# Patient Record
Sex: Female | Born: 1980 | Race: White | Hispanic: No | Marital: Married
Health system: Southern US, Community
[De-identification: ages and names within clinical notes are randomized; demographics above are authoritative.]

---

## 2017-11-04 ENCOUNTER — Ambulatory Visit (HOSPITAL_COMMUNITY): Payer: 59 | Admitting: Certified Registered Nurse Anesthetist

## 2017-11-04 ENCOUNTER — Other Ambulatory Visit: Payer: Self-pay

## 2017-11-04 ENCOUNTER — Ambulatory Visit (HOSPITAL_COMMUNITY): Payer: 59

## 2017-11-04 ENCOUNTER — Encounter (HOSPITAL_COMMUNITY)
Admission: AD | Disposition: A | Discharge status: Home / Self Care | Payer: Self-pay | Source: Ambulatory Visit | Attending: Obstetrics and Gynecology

## 2017-11-04 ENCOUNTER — Ambulatory Visit (HOSPITAL_COMMUNITY)
Admission: AD | Admit: 2017-11-04 | Discharge: 2017-11-04 | Disposition: A | Discharge status: Home / Self Care | Payer: 59 | Source: Ambulatory Visit | Attending: Obstetrics and Gynecology | Admitting: Obstetrics and Gynecology

## 2017-11-04 DIAGNOSIS — O039 Complete or unspecified spontaneous abortion without complication: Secondary | ICD-10-CM

## 2017-11-04 DIAGNOSIS — O021 Missed abortion: Secondary | ICD-10-CM | POA: Diagnosis not present

## 2017-11-04 DIAGNOSIS — Z791 Long term (current) use of non-steroidal anti-inflammatories (NSAID): Secondary | ICD-10-CM | POA: Insufficient documentation

## 2017-11-04 HISTORY — PX: DILATION AND EVACUATION: SHX1459

## 2017-11-04 LAB — CBC
HCT: 41.1 % (ref 36.0–46.0)
Hemoglobin: 14.4 g/dL (ref 12.0–15.0)
MCH: 32.1 pg (ref 26.0–34.0)
MCHC: 35 g/dL (ref 30.0–36.0)
MCV: 91.5 fL (ref 80.0–100.0)
NRBC: 0 % (ref 0.0–0.2)
PLATELETS: 249 10*3/uL (ref 150–400)
RBC: 4.49 MIL/uL (ref 3.87–5.11)
RDW: 12.6 % (ref 11.5–15.5)
WBC: 9.2 10*3/uL (ref 4.0–10.5)

## 2017-11-04 LAB — ABO/RH: ABO/RH(D): A POS

## 2017-11-04 LAB — TYPE AND SCREEN
ABO/RH(D): A POS
ANTIBODY SCREEN: NEGATIVE

## 2017-11-04 SURGERY — DILATION AND EVACUATION, UTERUS
Anesthesia: General | Site: Vagina

## 2017-11-04 MED ORDER — LIDOCAINE HCL (CARDIAC) PF 100 MG/5ML IV SOSY
PREFILLED_SYRINGE | INTRAVENOUS | Status: AC
  Filled 2017-11-04: qty 5

## 2017-11-04 MED ORDER — PROPOFOL 10 MG/ML IV BOLUS
INTRAVENOUS | Status: AC
  Filled 2017-11-04: qty 40

## 2017-11-04 MED ORDER — FLUMAZENIL 0.5 MG/5ML IV SOLN
INTRAVENOUS | Status: AC
  Filled 2017-11-04: qty 5

## 2017-11-04 MED ORDER — ACETAMINOPHEN 500 MG PO TABS
ORAL_TABLET | ORAL | Status: AC
  Filled 2017-11-04: qty 2

## 2017-11-04 MED ORDER — HYDROCODONE-ACETAMINOPHEN 5-325 MG PO TABS: 1.0000 | ORAL_TABLET | Freq: Four times a day (QID) | ORAL | 0 refills | Status: AC | PRN

## 2017-11-04 MED ORDER — GLYCOPYRROLATE 0.2 MG/ML IJ SOLN
INTRAMUSCULAR | Status: AC
  Filled 2017-11-04: qty 1

## 2017-11-04 MED ORDER — LACTATED RINGERS IV SOLN
INTRAVENOUS | Status: DC
  Administered 2017-11-04 (×2): via INTRAVENOUS

## 2017-11-04 MED ORDER — KETOROLAC TROMETHAMINE 30 MG/ML IJ SOLN
INTRAMUSCULAR | Status: AC
  Filled 2017-11-04: qty 1

## 2017-11-04 MED ORDER — SCOPOLAMINE 1 MG/3DAYS TD PT72: 1.0000 | MEDICATED_PATCH | TRANSDERMAL | Status: DC

## 2017-11-04 MED ORDER — DEXAMETHASONE SODIUM PHOSPHATE 4 MG/ML IJ SOLN
INTRAMUSCULAR | Status: DC | PRN
  Administered 2017-11-04: 8 mg via INTRAVENOUS

## 2017-11-04 MED ORDER — GLYCOPYRROLATE 0.2 MG/ML IJ SOLN
INTRAMUSCULAR | Status: DC | PRN
  Administered 2017-11-04: 0.1 mg via INTRAVENOUS

## 2017-11-04 MED ORDER — SCOPOLAMINE 1 MG/3DAYS TD PT72
1.0000 | MEDICATED_PATCH | Freq: Once | TRANSDERMAL | Status: DC
  Administered 2017-11-04: 1.5 mg via TRANSDERMAL

## 2017-11-04 MED ORDER — ONDANSETRON HCL 4 MG/2ML IJ SOLN
INTRAMUSCULAR | Status: DC | PRN
  Administered 2017-11-04: 4 mg via INTRAVENOUS

## 2017-11-04 MED ORDER — SCOPOLAMINE 1 MG/3DAYS TD PT72
MEDICATED_PATCH | TRANSDERMAL | Status: AC
  Filled 2017-11-04: qty 1

## 2017-11-04 MED ORDER — MIDAZOLAM HCL 2 MG/2ML IJ SOLN
INTRAMUSCULAR | Status: AC
  Filled 2017-11-04: qty 2

## 2017-11-04 MED ORDER — ACETAMINOPHEN 500 MG PO TABS
1000.0000 mg | ORAL_TABLET | Freq: Once | ORAL | Status: AC
  Administered 2017-11-04: 1000 mg via ORAL

## 2017-11-04 MED ORDER — FLUMAZENIL 0.5 MG/5ML IV SOLN
INTRAVENOUS | Status: DC | PRN
  Administered 2017-11-04: 0.3 mg via INTRAVENOUS

## 2017-11-04 MED ORDER — PROPOFOL 10 MG/ML IV BOLUS
INTRAVENOUS | Status: DC | PRN
  Administered 2017-11-04: 180 mg via INTRAVENOUS

## 2017-11-04 MED ORDER — LIDOCAINE HCL 1 % IJ SOLN
INTRAMUSCULAR | Status: DC | PRN
  Administered 2017-11-04: 10 mL

## 2017-11-04 MED ORDER — MIDAZOLAM HCL 2 MG/2ML IJ SOLN
INTRAMUSCULAR | Status: DC | PRN
  Administered 2017-11-04: 2 mg via INTRAVENOUS

## 2017-11-04 MED ORDER — LIDOCAINE HCL URETHRAL/MUCOSAL 2 % EX GEL
CUTANEOUS | Status: AC
  Filled 2017-11-04: qty 5

## 2017-11-04 MED ORDER — IBUPROFEN 800 MG PO TABS: 800.0000 mg | ORAL_TABLET | Freq: Three times a day (TID) | ORAL | 0 refills | Status: AC | PRN

## 2017-11-04 MED ORDER — FENTANYL CITRATE (PF) 100 MCG/2ML IJ SOLN
INTRAMUSCULAR | Status: AC
  Filled 2017-11-04: qty 4

## 2017-11-04 MED ORDER — FENTANYL CITRATE (PF) 250 MCG/5ML IJ SOLN
INTRAMUSCULAR | Status: DC | PRN
  Administered 2017-11-04 (×3): 50 ug via INTRAVENOUS

## 2017-11-04 MED ORDER — LIDOCAINE HCL 1 % IJ SOLN
INTRAMUSCULAR | Status: AC
  Filled 2017-11-04: qty 20

## 2017-11-04 MED ORDER — LIDOCAINE HCL (CARDIAC) PF 100 MG/5ML IV SOSY
PREFILLED_SYRINGE | INTRAVENOUS | Status: DC | PRN
  Administered 2017-11-04: 100 mg via INTRAVENOUS

## 2017-11-04 MED ORDER — KETOROLAC TROMETHAMINE 30 MG/ML IJ SOLN
INTRAMUSCULAR | Status: DC | PRN
  Administered 2017-11-04: 30 mg via INTRAVENOUS

## 2017-11-04 MED ORDER — ONDANSETRON HCL 4 MG/2ML IJ SOLN
INTRAMUSCULAR | Status: AC
  Filled 2017-11-04: qty 2

## 2017-11-04 SURGICAL SUPPLY — 18 items
CATH ROBINSON RED A/P 16FR (CATHETERS) ×3 IMPLANT
DECANTER SPIKE VIAL GLASS SM (MISCELLANEOUS) ×3 IMPLANT
DILATOR CANAL MILEX (MISCELLANEOUS) IMPLANT
GLOVE BIO SURGEON STRL SZ7 (GLOVE) ×3 IMPLANT
GLOVE BIOGEL PI IND STRL 7.0 (GLOVE) ×1 IMPLANT
GLOVE BIOGEL PI INDICATOR 7.0 (GLOVE) ×2
GOWN STRL REUS W/TWL LRG LVL3 (GOWN DISPOSABLE) ×6 IMPLANT
KIT BERKELEY 1ST TRIMESTER 3/8 (MISCELLANEOUS) ×3 IMPLANT
NS IRRIG 1000ML POUR BTL (IV SOLUTION) ×3 IMPLANT
PACK VAGINAL MINOR WOMEN LF (CUSTOM PROCEDURE TRAY) ×3 IMPLANT
PAD OB MATERNITY 4.3X12.25 (PERSONAL CARE ITEMS) ×3 IMPLANT
PAD PREP 24X48 CUFFED NSTRL (MISCELLANEOUS) ×3 IMPLANT
SET BERKELEY SUCTION TUBING (SUCTIONS) ×3 IMPLANT
TOWEL OR 17X24 6PK STRL BLUE (TOWEL DISPOSABLE) ×6 IMPLANT
VACURETTE 10 RIGID CVD (CANNULA) IMPLANT
VACURETTE 7MM CVD STRL WRAP (CANNULA) IMPLANT
VACURETTE 8 RIGID CVD (CANNULA) IMPLANT
VACURETTE 9 RIGID CVD (CANNULA) IMPLANT

## 2017-11-04 NOTE — Anesthesia Preprocedure Evaluation (Signed)
Anesthesia Evaluation  Patient identified by MRN, date of birth, ID band Patient awake    Reviewed: Allergy & Precautions, H&P , NPO status , Patient's Chart, lab work & pertinent test results  Airway Mallampati: II  TM Distance: >3 FB Neck ROM: Full    Dental no notable dental hx. (+) Teeth Intact   Pulmonary neg pulmonary ROS,    Pulmonary exam normal breath sounds clear to auscultation       Cardiovascular negative cardio ROS Normal cardiovascular exam Rhythm:Regular Rate:Normal     Neuro/Psych negative neurological ROS  negative psych ROS   GI/Hepatic negative GI ROS,   Endo/Other    Renal/GU      Musculoskeletal   Abdominal   Peds  Hematology   Anesthesia Other Findings   Reproductive/Obstetrics (+) Pregnancy                             Lab Results  Component Value Date   WBC 9.2 11/04/2017   HGB 14.4 11/04/2017   HCT 41.1 11/04/2017   MCV 91.5 11/04/2017   PLT 249 11/04/2017    Anesthesia Physical Anesthesia Plan  ASA: II  Anesthesia Plan: General   Post-op Pain Management:    Induction: Intravenous  PONV Risk Score and Plan: 4 or greater and Ondansetron, Treatment may vary due to age or medical condition and Scopolamine patch - Pre-op  Airway Management Planned: LMA  Additional Equipment:   Intra-op Plan:   Post-operative Plan:   Informed Consent: I have reviewed the patients History and Physical, chart, labs and discussed the procedure including the risks, benefits and alternatives for the proposed anesthesia with the patient or authorized representative who has indicated his/her understanding and acceptance.   Dental advisory given  Plan Discussed with:   Anesthesia Plan Comments: (10 wks NPO since before 0900)        Anesthesia Quick Evaluation

## 2017-11-04 NOTE — Transfer of Care (Signed)
Immediate Anesthesia Transfer of Care Note  Patient: Ariel Walsh  Procedure(s) Performed: DILATATION AND EVACUATION (N/A Vagina )  Patient Location: PACU  Anesthesia Type:General  Level of Consciousness: awake, alert  and oriented  Airway & Oxygen Therapy: Patient Spontanous Breathing and Patient connected to nasal cannula oxygen  Post-op Assessment: Report given to RN and Post -op Vital signs reviewed and stable  Post vital signs: Reviewed and stable  Last Vitals:  Vitals Value Taken Time  BP 105/56 11/04/2017  5:15 PM  Temp    Pulse 72 11/04/2017  5:18 PM  Resp 13 11/04/2017  5:18 PM  SpO2 100 % 11/04/2017  5:18 PM  Vitals shown include unvalidated device data.  Last Pain:  Vitals:   11/04/17 1508  TempSrc: Oral  PainSc: 0-No pain      Patients Stated Pain Goal: 4 (11/04/17 1508)  Complications: No apparent anesthesia complications

## 2017-11-04 NOTE — Op Note (Signed)
Pre-Operative Diagnosis: 1) 10+2-week missed miscarriage Postoperative Diagnosis: 1) 10+2-week missed miscarriage Procedure: Suction dilation and evacuation Surgeon: Dr. Waynard Reeds Assistant: None Operative Findings: 8-week size uterus. Specimen: Products of conception EBL: Total I/O In: 1600 [I.V.:1600] Out: 225 [Urine:200; Blood:25]   Ms. Lobo is a 37 year old gravida 1 para 0 who presents for definitive surgical management for missed miscarriage. Please see the patient's history and physical for complete details of the history. Management options were discussed with the patient. R/B/A reviewed. Following appropriate informed consent was taken to the operating room. The patient was appropriately identified during a time out procedure. General anesthesia was administered and the patient was placed in the dorsal lithotomy position. The patient was prepped and draped in the normal sterile fashion. A speculum was placed into the vagina, a single-tooth tenaculum was placed on the anterior lip of the cervix, and 10 cc of 1% lidocaine was administered in a paracervical fashion. The cervix was serially dilated with Hank dilators. . A 8 suction curet was then passed to the fundus, the vacuum was engaged, and 3 suction passes were performed with a curette. A Sharp curettage was performed and a gritty texture was noted. A final suction pass was performed with minimal results. This completed the procedure. The patient tolerated the procedure well was brought to the recovery room in stable condition for the procedure. All sponge and needle counts correct x2.

## 2017-11-04 NOTE — H&P (Signed)
Ariel Walsh is an 37 y.o. female.  37 yo G1P0 @ 10+2 by LMP presents for D&E for missed ab. Office [place] yesterday confirmed demise of fetus. CRL 7+6 without cardiac activity. Pt had a previous US in [facility] office at 7+1 with cardiac activity. Repeat US here confirms demise. Management options reviewed with the patient and she wishes to proceed with D&E.   No LMP recorded.    No past medical history on file.    No family history on file.  Social History:  has no tobacco, alcohol, and drug history on file.  Allergies: Allergies not on file  No medications prior to admission.    ROS   There were no vitals taken for this visit. Physical Exam  Vitals:   11/04/17 1508  BP: 136/85  Pulse: 74  Resp: 16  Temp: 98.6 F (37 C)  TempSrc: Oral  SpO2: 100%  Weight: 71.7 kg  Height: 5\' 5"  (1.651 m)   Results for orders placed or performed during the hospital encounter of 11/04/17 (from the past 24 hour(s))  CBC     Status: None   Collection Time: 11/04/17  2:00 PM  Result Value Ref Range   WBC 9.2 4.0 - 10.5 K/uL   RBC 4.49 3.87 - 5.11 MIL/uL   Hemoglobin 14.4 12.0 - 15.0 g/dL   HCT 16.1 09.6 - 04.5 %   MCV 91.5 80.0 - 100.0 fL   MCH 32.1 26.0 - 34.0 pg   MCHC 35.0 30.0 - 36.0 g/dL   RDW 40.9 81.1 - 91.4 %   Platelets 249 150 - 400 K/uL   nRBC 0.0 0.0 - 0.2 %   Aox3, NAD Abd soft Normal work of breathing  No results found for this or any previous visit (from the past 24 hour(s)).  No results found.  Assessment/Plan: 1) Admit 2) informed consent obtained 3) SCDs to OR 4) Proceed to OR   Waynard Reeds 11/04/2017, 2:17 PM

## 2017-11-04 NOTE — Discharge Instructions (Signed)
DISCHARGE INSTRUCTIONS: D&C / D&E The following instructions have been prepared to help you care for yourself upon your return home.   Personal hygiene:  Use sanitary pads for vaginal drainage, not tampons.  Shower the day after your procedure.  NO tub baths, pools or Jacuzzis for 2-3 weeks.  Wipe front to back after using the bathroom.  Activity and limitations:  Do NOT drive or operate any equipment for 24 hours. The effects of anesthesia are still present and drowsiness may result.  Do NOT rest in bed all day.  Walking is encouraged.  Walk up and down stairs slowly.  You may resume your normal activity in one to two days or as indicated by your physician.  Sexual activity: NO intercourse for at least 2 weeks after the procedure, or as indicated by your physician.  Diet: Eat a light meal as desired this evening. You may resume your usual diet tomorrow.  Return to work: You may resume your work activities in one to two days or as indicated by your doctor.  What to expect after your surgery: Expect to have vaginal bleeding/discharge for 2-3 days and spotting for up to 10 days. It is not unusual to have soreness for up to 1-2 weeks. You may have a slight burning sensation when you urinate for the first day. Mild cramps may continue for a couple of days. You may have a regular period in 2-6 weeks.  Call your doctor for any of the following:  Excessive vaginal bleeding, saturating and changing one pad every hour.  Inability to urinate 6 hours after discharge from hospital.  Pain not relieved by pain medication.  Fever of 100.4 F or greater.  Unusual vaginal discharge or odor.   Call for an appointment:    Patients signature: ______________________  Nurses signature ________________________  Support person's signature_______________________   Post Anesthesia Home Care Instructions  NO IBUPROFEN PRODUCTS UNTIL: 10:45 PM TONIGHT  Activity: Get plenty of rest  for the remainder of the day. A responsible individual must stay with you for 24 hours following the procedure.  For the next 24 hours, DO NOT: -Drive a car -Advertising copywriter -Drink alcoholic beverages -Take any medication unless instructed by your physician -Make any legal decisions or sign important papers.  Meals: Start with liquid foods such as gelatin or soup. Progress to regular foods as tolerated. Avoid greasy, spicy, heavy foods. If nausea and/or vomiting occur, drink only clear liquids until the nausea and/or vomiting subsides. Call your physician if vomiting continues.  Special Instructions/Symptoms: Your throat may feel dry or sore from the anesthesia or the breathing tube placed in your throat during surgery. If this causes discomfort, gargle with warm salt water. The discomfort should disappear within 24 hours.  If you had a scopolamine patch placed behind your ear for the management of post- operative nausea and/or vomiting:  1. The medication in the patch is effective for 72 hours, after which it should be removed.  Wrap patch in a tissue and discard in the trash. Wash hands thoroughly with soap and water. 2. You may remove the patch earlier than 72 hours if you experience unpleasant side effects which may include dry mouth, dizziness or visual disturbances. 3. Avoid touching the patch. Wash your hands with soap and water after contact with the patch.

## 2017-11-04 NOTE — Anesthesia Procedure Notes (Signed)
Procedure Name: LMA Insertion Date/Time: 11/04/2017 4:33 PM Performed by: Graciela Husbands, CRNA Pre-anesthesia Checklist: Patient identified, Patient being monitored, Emergency Drugs available, Timeout performed and Suction available Patient Re-evaluated:Patient Re-evaluated prior to induction Oxygen Delivery Method: Circle System Utilized Preoxygenation: Pre-oxygenation with 100% oxygen Induction Type: IV induction Ventilation: Mask ventilation without difficulty LMA: LMA inserted LMA Size: 4.0 Number of attempts: 1 Placement Confirmation: positive ETCO2 and breath sounds checked- equal and bilateral Tube secured with: Tape Dental Injury: Teeth and Oropharynx as per pre-operative assessment

## 2017-11-05 NOTE — Anesthesia Postprocedure Evaluation (Signed)
Anesthesia Post Note  Patient: [employer]  Procedure(s) Performed: DILATATION AND EVACUATION (N/A Vagina )     Patient location during evaluation: PACU Anesthesia Type: General Level of consciousness: awake and alert Pain management: pain level controlled Vital Signs Assessment: post-procedure vital signs reviewed and stable Respiratory status: spontaneous breathing, nonlabored ventilation, respiratory function stable and patient connected to nasal cannula oxygen Cardiovascular status: blood pressure returned to baseline and stable Postop Assessment: no apparent nausea or vomiting Anesthetic complications: no    Last Vitals:  Vitals:   11/04/17 1815 11/04/17 1852  BP: (!) 101/59 114/73  Pulse: 63 68  Resp: 16 16  Temp: 36.9 C 37.3 C  SpO2: 100% 100%    Last Pain:  Vitals:   11/04/17 1852  TempSrc:   PainSc: 0-No pain                 Trevor Iha

## 2017-11-06 ENCOUNTER — Encounter (HOSPITAL_COMMUNITY): Payer: Self-pay | Admitting: Obstetrics and Gynecology

## 2019-11-08 IMAGING — US US OB TRANSVAGINAL
1 series · 15 of 28 positions shown · non-contrast
Comparison: None.

CLINICAL DATA: Threatened abortion.  Suspected miscarriage.

EXAM:
TRANSVAGINAL OB ULTRASOUND
TECHNIQUE: Transvaginal ultrasound was performed for complete evaluation of the
gestation as well as the maternal uterus, adnexal regions, and
pelvic cul-de-sac.

[Series 1: us ob transvaginal · 15 of 42 slices shown]
[im 1/42]
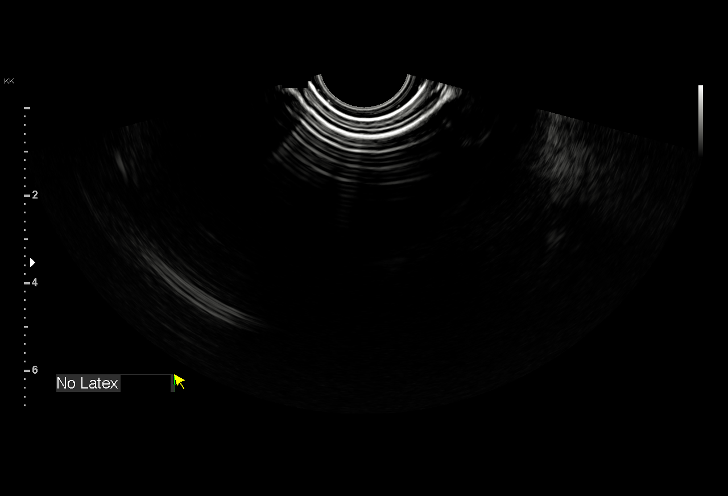
[im 4/42]
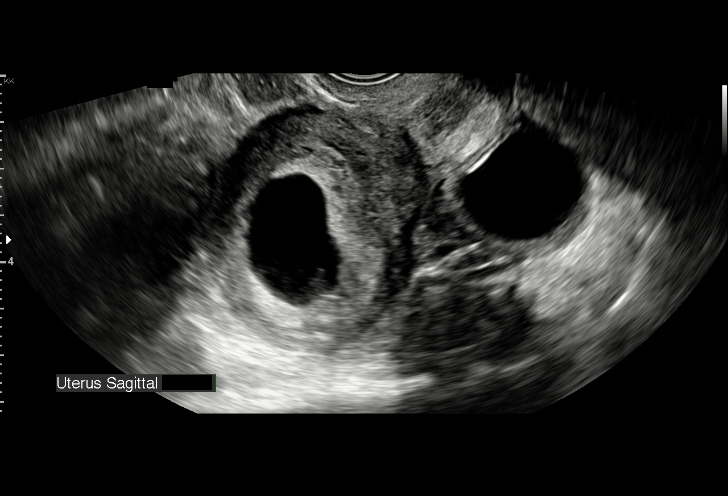
[im 7/42]
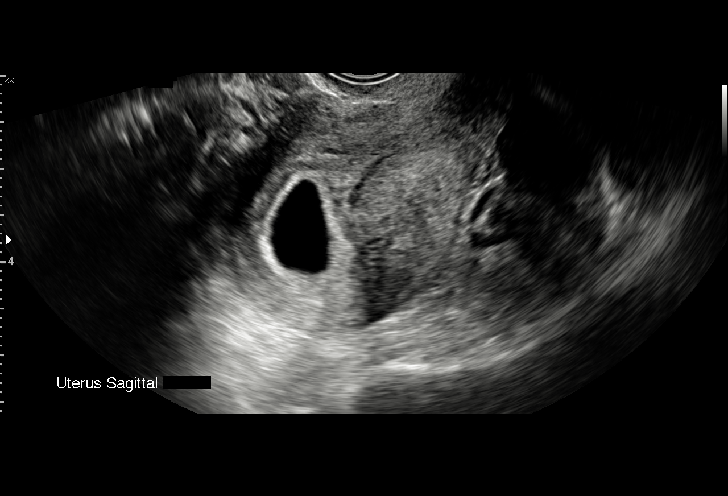
[im 10/42]
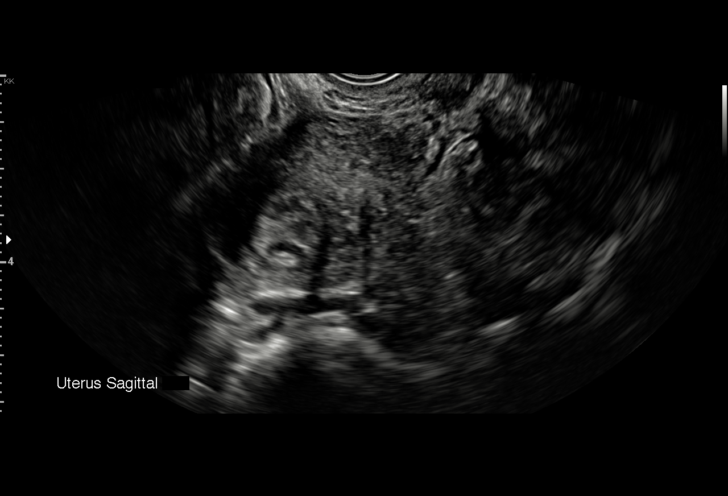
[im 13/42]
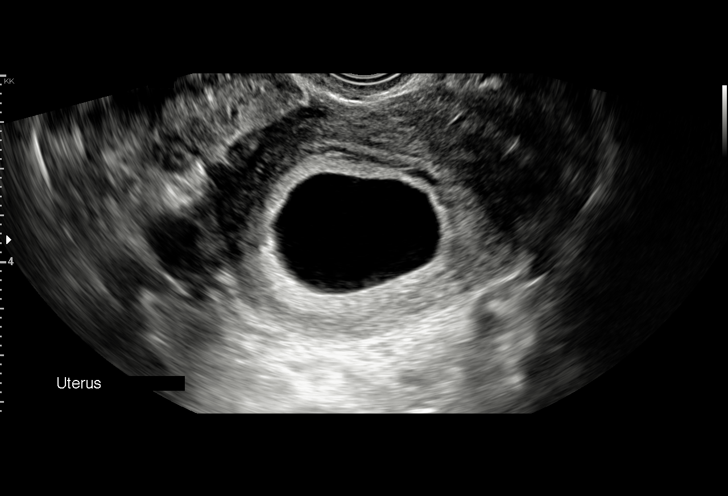
[im 16/42]
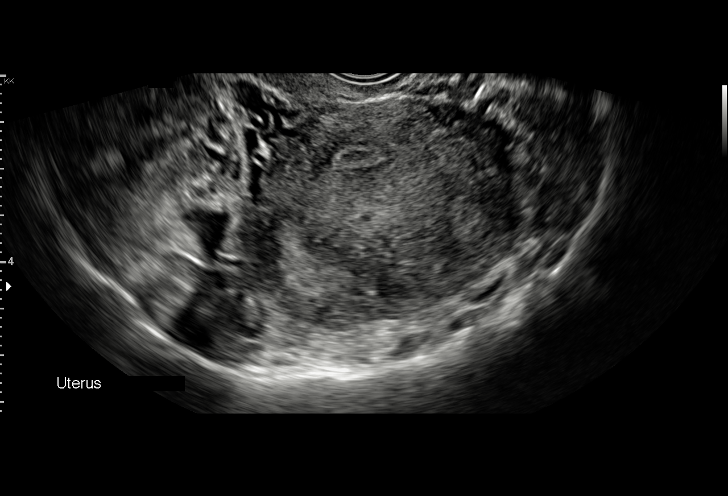
[im 19/42]
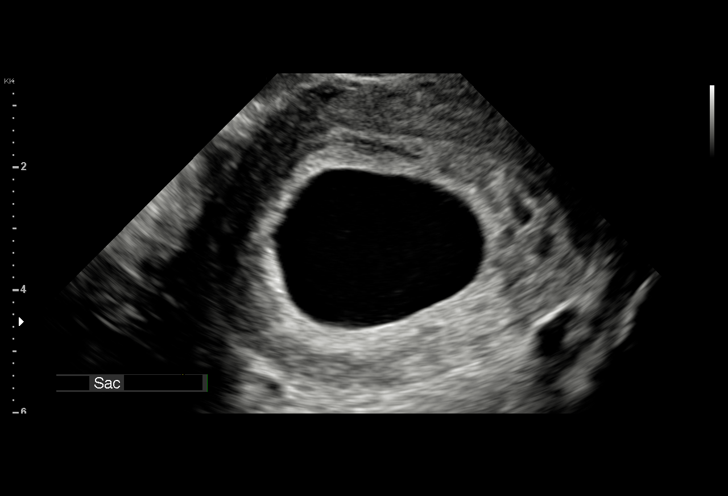
[im 22/42]
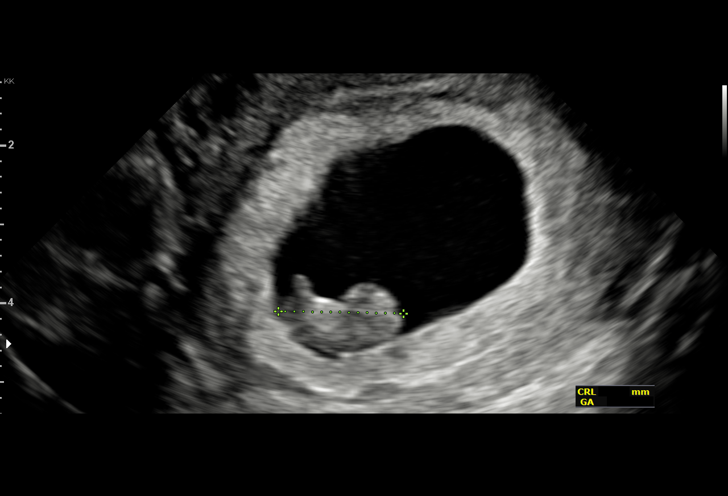
[im 23/42]
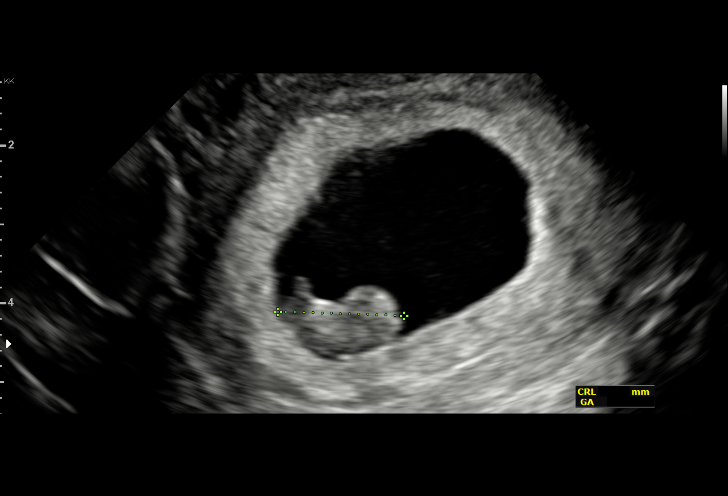
[im 26/42]
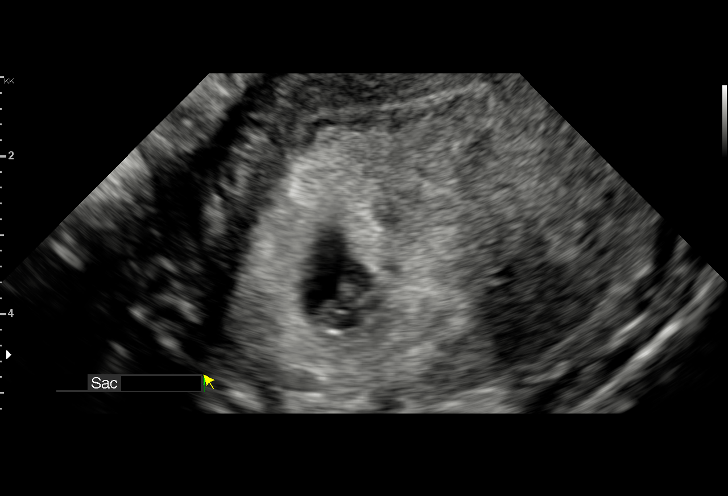
[im 29/42]
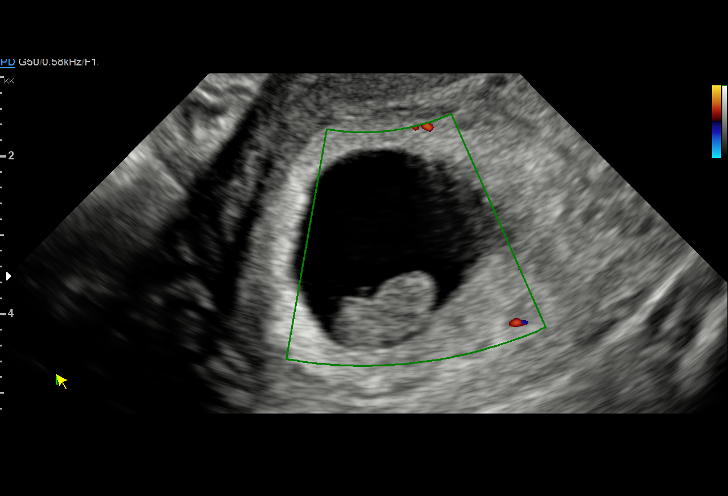
[im 32/42]
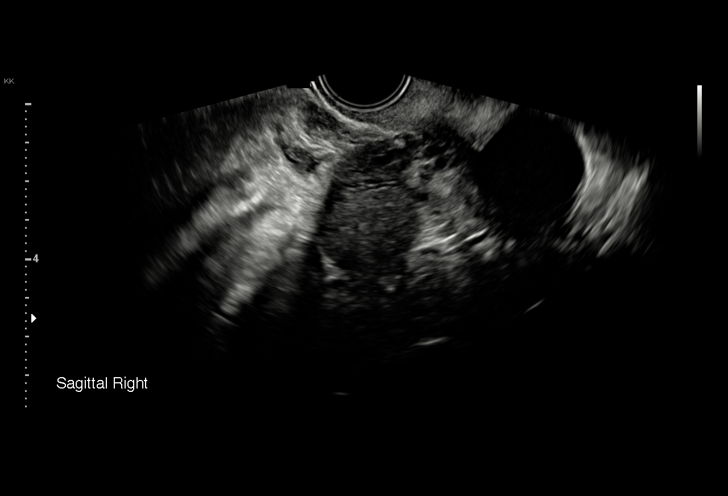
[im 35/42]
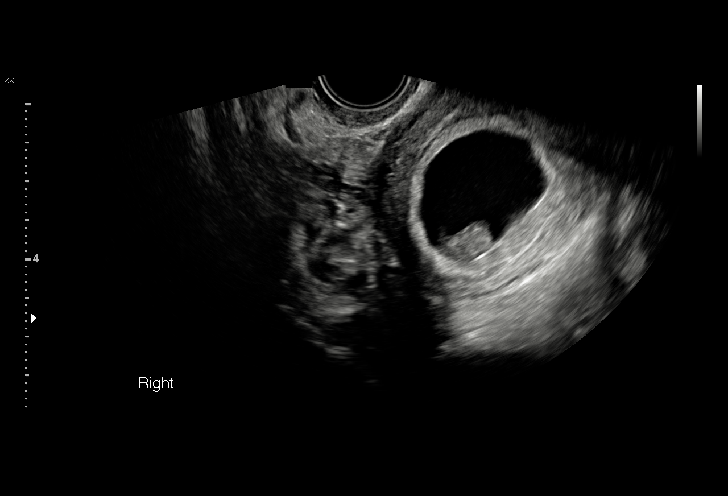
[im 38/42]
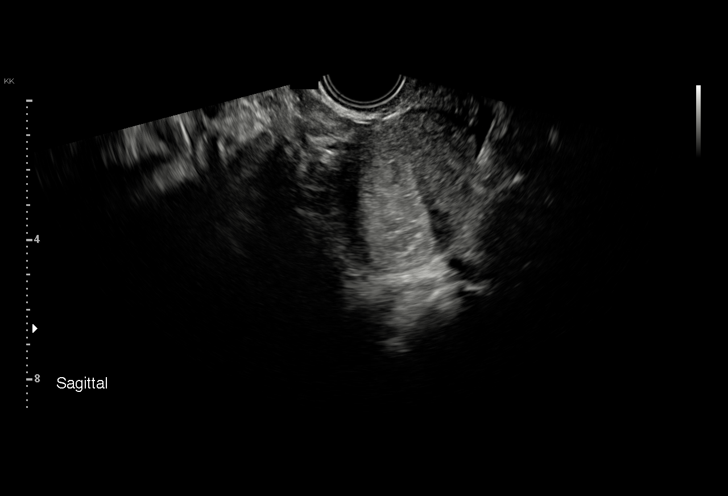
[im 42/42]
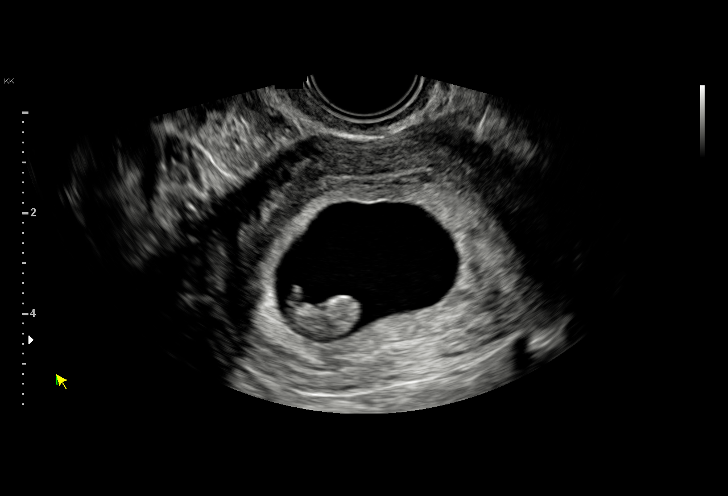

[15 of 28 positions shown; findings below may reference images not displayed]

FINDINGS: Intrauterine gestational sac: Single

Yolk sac:  Visualized.

Embryo:  Visualized.

Cardiac Activity: Not Visualized.

CRL:   16 mm   7 w 6 d                  US EDC: 06/17/2018

Subchorionic hemorrhage:  None visualized.

Maternal uterus/adnexae: Ovaries are not directly visualized on this
exam, however no adnexal mass or abnormal free fluid identified.
IMPRESSION: Findings meet definitive criteria for failed IUP. This follows SRU
consensus guidelines: Diagnostic Criteria for Nonviable Pregnancy
Early in the First Trimester. N Engl J Med 9962;[DATE].
